# Patient Record
Sex: Female | Born: 1945 | Race: White | Hispanic: No | Marital: Married | Smoking: Never smoker
Health system: Southern US, Community
[De-identification: ages and names within clinical notes are randomized; demographics above are authoritative.]

## PROBLEM LIST (undated history)

## (undated) DIAGNOSIS — E7800 Pure hypercholesterolemia, unspecified: Secondary | ICD-10-CM

## (undated) DIAGNOSIS — I10 Essential (primary) hypertension: Secondary | ICD-10-CM

## (undated) DIAGNOSIS — K219 Gastro-esophageal reflux disease without esophagitis: Secondary | ICD-10-CM

## (undated) DIAGNOSIS — M1990 Unspecified osteoarthritis, unspecified site: Secondary | ICD-10-CM

## (undated) HISTORY — PX: ABDOMINAL HYSTERECTOMY: SHX81

## (undated) HISTORY — PX: STOMACH SURGERY: SHX791

---

## 2015-03-06 ENCOUNTER — Emergency Department (HOSPITAL_BASED_OUTPATIENT_CLINIC_OR_DEPARTMENT_OTHER)
Admission: EM | Admit: 2015-03-06 | Discharge: 2015-03-06 | Disposition: A | Discharge status: Home / Self Care | Payer: Medicare HMO | Attending: Emergency Medicine | Admitting: Emergency Medicine

## 2015-03-06 ENCOUNTER — Encounter (HOSPITAL_BASED_OUTPATIENT_CLINIC_OR_DEPARTMENT_OTHER): Payer: Self-pay | Admitting: Emergency Medicine

## 2015-03-06 DIAGNOSIS — K219 Gastro-esophageal reflux disease without esophagitis: Secondary | ICD-10-CM | POA: Insufficient documentation

## 2015-03-06 DIAGNOSIS — R51 Headache: Secondary | ICD-10-CM | POA: Insufficient documentation

## 2015-03-06 DIAGNOSIS — I10 Essential (primary) hypertension: Secondary | ICD-10-CM | POA: Diagnosis not present

## 2015-03-06 DIAGNOSIS — Z8739 Personal history of other diseases of the musculoskeletal system and connective tissue: Secondary | ICD-10-CM | POA: Diagnosis not present

## 2015-03-06 DIAGNOSIS — E7800 Pure hypercholesterolemia, unspecified: Secondary | ICD-10-CM | POA: Diagnosis not present

## 2015-03-06 DIAGNOSIS — Z79899 Other long term (current) drug therapy: Secondary | ICD-10-CM | POA: Diagnosis not present

## 2015-03-06 DIAGNOSIS — R519 Headache, unspecified: Secondary | ICD-10-CM

## 2015-03-06 HISTORY — DX: Pure hypercholesterolemia, unspecified: E78.00

## 2015-03-06 HISTORY — DX: Gastro-esophageal reflux disease without esophagitis: K21.9

## 2015-03-06 HISTORY — DX: Unspecified osteoarthritis, unspecified site: M19.90

## 2015-03-06 HISTORY — DX: Essential (primary) hypertension: I10

## 2015-03-06 LAB — CBC WITH DIFFERENTIAL/PLATELET
BASOS ABS: 0 10*3/uL (ref 0.0–0.1)
BASOS PCT: 0 %
Eosinophils Absolute: 0.1 10*3/uL (ref 0.0–0.7)
Eosinophils Relative: 1 %
HEMATOCRIT: 41.6 % (ref 36.0–46.0)
HEMOGLOBIN: 13.8 g/dL (ref 12.0–15.0)
LYMPHS PCT: 32 %
Lymphs Abs: 2.5 10*3/uL (ref 0.7–4.0)
MCH: 29.6 pg (ref 26.0–34.0)
MCHC: 33.2 g/dL (ref 30.0–36.0)
MCV: 89.3 fL (ref 78.0–100.0)
MONO ABS: 0.5 10*3/uL (ref 0.1–1.0)
Monocytes Relative: 6 %
NEUTROS ABS: 4.7 10*3/uL (ref 1.7–7.7)
NEUTROS PCT: 61 %
Platelets: 228 10*3/uL (ref 150–400)
RBC: 4.66 MIL/uL (ref 3.87–5.11)
RDW: 13.1 % (ref 11.5–15.5)
WBC: 7.7 10*3/uL (ref 4.0–10.5)

## 2015-03-06 LAB — BASIC METABOLIC PANEL
ANION GAP: 7 (ref 5–15)
BUN: 13 mg/dL (ref 6–20)
CALCIUM: 9.6 mg/dL (ref 8.9–10.3)
CO2: 29 mmol/L (ref 22–32)
Chloride: 101 mmol/L (ref 101–111)
Creatinine, Ser: 0.97 mg/dL (ref 0.44–1.00)
GFR, EST NON AFRICAN AMERICAN: 58 mL/min — AB (ref >60)
GLUCOSE: 126 mg/dL — AB (ref 65–99)
POTASSIUM: 3.8 mmol/L (ref 3.5–5.1)
Sodium: 137 mmol/L (ref 135–145)

## 2015-03-06 MED ORDER — KETOROLAC TROMETHAMINE 60 MG/2ML IM SOLN
60.0000 mg | Freq: Once | INTRAMUSCULAR | Status: AC
  Administered 2015-03-06: 60 mg via INTRAMUSCULAR
  Filled 2015-03-06: qty 2

## 2015-03-06 NOTE — ED Provider Notes (Signed)
CSN: [id number]     Arrival date & time 03/06/15  1741 History  By signing my name below, I, Jarvis Morgan, attest that this documentation has been prepared under the direction and in the presence of No att. providers found. Electronically Signed: Jarvis Morgan, ED Scribe. 03/07/2015. 12:56 PM.    Chief Complaint  Patient presents with  . Hypertension   The history is provided by the patient. No language interpreter was used.    HPI Comments: Chelsea Perez is a 69 y.o. female with a h/o HTN who presents to the Emergency Department complaining of elevated blood pressure readings since yesterday. She notes yesterday her readings yesterday had a high of 184/72 and today her highest was 176/64. She states she regularly takes losartan-hctz and amlodipine . Pt reports she took an extra dose of her amlodipine last night with no mild relief. Pt endorses associated HA. Describes it as sinus pressure, and reports it is still present and dull now despite BP being normal.  Reports seeing ENT physician before for sinus irritation and has had CT scans done of her sinuses. Read side effects of losartan and notes that sinus inflmamation is one of these.  She denies any alleviating/aggravating factors. She called her PCP today who sent her to the ER for evaluation. She denies any recent illness. Pt denies any congestion, cough, sore throat, chest pain, SOB, vision changes, slurred speech, change in speech, difficulty walking, weakness to one side of body, numbness, dizziness, or other associated symptoms.   Past Medical History  Diagnosis Date  . Arthritis   . Hypertension   . GERD (gastroesophageal reflux disease)   . High cholesterol    Past Surgical History  Procedure Laterality Date  . Stomach surgery      For reflux  . Abdominal hysterectomy     No family history on file. Social History  Substance Use Topics  . Smoking status: Never Smoker   . Smokeless tobacco: None  . Alcohol Use: No    OB History    No data available     Review of Systems  Constitutional: Negative for fever.  HENT: Positive for sinus pressure. Negative for congestion and sore throat.   Eyes: Negative for visual disturbance.  Respiratory: Negative for cough and shortness of breath.   Cardiovascular: Negative for chest pain.  Gastrointestinal: Negative for nausea, vomiting, abdominal pain and diarrhea.  Genitourinary: Negative for difficulty urinating.  Musculoskeletal: Negative for back pain, gait problem and neck pain.  Skin: Negative for rash.  Neurological: Positive for headaches. Negative for dizziness, syncope, speech difficulty, weakness, light-headedness and numbness.      Allergies  Review of patient's allergies indicates no known allergies.  Home Medications   Prior to Admission medications   Medication Sig Start Date End Date Taking? Authorizing Provider  amLODipine (NORVASC) 5 MG tablet Take 5 mg by mouth daily.   Yes Historical Provider, MD  losartan-hydrochlorothiazide (HYZAAR) 100-25 MG tablet Take 1 tablet by mouth daily.   Yes Historical Provider, MD  omeprazole (PRILOSEC) 40 MG capsule Take 40 mg by mouth daily.   Yes Historical Provider, MD  pravastatin (PRAVACHOL) 10 MG tablet Take 10 mg by mouth daily.   Yes Historical Provider, MD   Triage Vitals: BP 125/73 mmHg  Pulse 71  Temp(Src) 97.8 F (36.6 C) (Oral)  Resp 18  Ht  (1.626 m)  Wt 152 lb (68.947 kg)  BMI 26.08 kg/m2  SpO2 98%  Physical Exam  Constitutional: She is  oriented to person, place, and time. She appears well-developed and well-nourished. No distress.  HENT:  Head: Normocephalic and atraumatic.  Eyes: Conjunctivae and EOM are normal. Pupils are equal, round, and reactive to light.  Neck: Normal range of motion. Neck supple. No tracheal deviation present.  Cardiovascular: Normal rate, regular rhythm, normal heart sounds and intact distal pulses.  Exam reveals no gallop and no friction rub.   No  murmur heard. Pulmonary/Chest: Effort normal and breath sounds normal. No respiratory distress. She has no wheezes. She has no rales.  Abdominal: Soft. She exhibits no distension. There is no tenderness. There is no guarding.  Musculoskeletal: Normal range of motion. She exhibits no edema or tenderness.  Neurological: She is alert and oriented to person, place, and time. She has normal strength. No cranial nerve deficit or sensory deficit. Coordination and gait normal. GCS eye subscore is 4. GCS verbal subscore is 5. GCS motor subscore is 6.  Skin: Skin is warm and dry. No rash noted. She is not diaphoretic. No erythema.  Psychiatric: She has a normal mood and affect. Her behavior is normal.  Nursing note and vitals reviewed.   ED Course  Procedures (including critical care time)  DIAGNOSTIC STUDIES: Oxygen Saturation is 98% on RA, normal by my interpretation.    COORDINATION OF CARE: 8:20 PM- Will order BMP and CBC w/diff. Pt advised of plan for treatment and pt agrees.    Labs Review Labs Reviewed  BASIC METABOLIC PANEL - Abnormal; Notable for the following:    Glucose, Bld 126 (*)    GFR calc non Af Amer 58 (*)    All other components within normal limits  CBC WITH DIFFERENTIAL/PLATELET    Imaging Review No results found. I have personally reviewed and evaluated these lab results as part of my medical decision-making.   EKG Interpretation None      MDM   Final diagnoses:  Essential hypertension  Sinus headache   69yo female with history of hypertension, sinus pain for which she has seen ENT, hyperlipidemia, presents with concern for elevated BP and headache. Pt instructed to come to ED by PCP.  When blood pressure was checked today, it was found to be 170s systolic and patient presented to the ED. On arrival to the ED, BP was 140s systolic and trended down to 110.  Reports HA that began slowly, located around the sinuses, and consistent with prior irritation she has  had evaluation for with ENT.  No nasal discharge/fevers and doubt bacterial sinusitis. Patient without neurologic symptoms, no chest pain, no shortness of breath and have low suspicion for hypertensive emergencies including low suspicion for [facility]AH, hypertensive encephalopathy, stroke, MI, aortic dissection, pulmonary edema.  BMP was checked showing normal renal function. Given dose of toradol for sinus pain. Discussed importance of close primary care follow up and reasons to return to the ED in detail. Patient discharged in stable condition with understanding of reasons to return.     I personally performed the services described in this documentation, which was scribed in my presence. The recorded information has been reviewed and is accurate.     Alvira MondayErin Shianne Zeiser, MD 03/07/15 1259

## 2015-03-06 NOTE — ED Notes (Signed)
Elevated bp since yesterday.  Sent to Ed by her pmd office for eval.

## 2016-05-02 ENCOUNTER — Encounter (INDEPENDENT_AMBULATORY_CARE_PROVIDER_SITE_OTHER): Payer: Medicare HMO | Admitting: Ophthalmology

## 2016-05-02 DIAGNOSIS — I10 Essential (primary) hypertension: Secondary | ICD-10-CM

## 2016-05-02 DIAGNOSIS — H3562 Retinal hemorrhage, left eye: Secondary | ICD-10-CM

## 2016-05-02 DIAGNOSIS — H43813 Vitreous degeneration, bilateral: Secondary | ICD-10-CM

## 2016-05-02 DIAGNOSIS — H35033 Hypertensive retinopathy, bilateral: Secondary | ICD-10-CM

## 2016-06-13 ENCOUNTER — Encounter (INDEPENDENT_AMBULATORY_CARE_PROVIDER_SITE_OTHER): Payer: Medicare HMO | Admitting: Ophthalmology

## 2016-06-13 DIAGNOSIS — H43813 Vitreous degeneration, bilateral: Secondary | ICD-10-CM

## 2016-06-13 DIAGNOSIS — H35033 Hypertensive retinopathy, bilateral: Secondary | ICD-10-CM | POA: Diagnosis not present

## 2016-06-13 DIAGNOSIS — H3562 Retinal hemorrhage, left eye: Secondary | ICD-10-CM | POA: Diagnosis not present

## 2018-10-04 ENCOUNTER — Other Ambulatory Visit: Payer: Self-pay

## 2018-10-04 ENCOUNTER — Emergency Department (HOSPITAL_BASED_OUTPATIENT_CLINIC_OR_DEPARTMENT_OTHER): Payer: Medicare HMO

## 2018-10-04 ENCOUNTER — Encounter (HOSPITAL_BASED_OUTPATIENT_CLINIC_OR_DEPARTMENT_OTHER): Payer: Self-pay | Admitting: Emergency Medicine

## 2018-10-04 ENCOUNTER — Emergency Department (HOSPITAL_BASED_OUTPATIENT_CLINIC_OR_DEPARTMENT_OTHER)
Admission: EM | Admit: 2018-10-04 | Discharge: 2018-10-04 | Disposition: A | Discharge status: Home / Self Care | Payer: Medicare HMO | Attending: Emergency Medicine | Admitting: Emergency Medicine

## 2018-10-04 DIAGNOSIS — R079 Chest pain, unspecified: Secondary | ICD-10-CM | POA: Diagnosis not present

## 2018-10-04 DIAGNOSIS — Y999 Unspecified external cause status: Secondary | ICD-10-CM | POA: Insufficient documentation

## 2018-10-04 DIAGNOSIS — Z20828 Contact with and (suspected) exposure to other viral communicable diseases: Secondary | ICD-10-CM | POA: Insufficient documentation

## 2018-10-04 DIAGNOSIS — Y9389 Activity, other specified: Secondary | ICD-10-CM | POA: Diagnosis not present

## 2018-10-04 DIAGNOSIS — I10 Essential (primary) hypertension: Secondary | ICD-10-CM | POA: Insufficient documentation

## 2018-10-04 DIAGNOSIS — S06339A Contusion and laceration of cerebrum, unspecified, with loss of consciousness of unspecified duration, initial encounter: Secondary | ICD-10-CM | POA: Insufficient documentation

## 2018-10-04 DIAGNOSIS — W01198A Fall on same level from slipping, tripping and stumbling with subsequent striking against other object, initial encounter: Secondary | ICD-10-CM | POA: Insufficient documentation

## 2018-10-04 DIAGNOSIS — Y92000 Kitchen of unspecified non-institutional (private) residence as  the place of occurrence of the external cause: Secondary | ICD-10-CM | POA: Diagnosis not present

## 2018-10-04 DIAGNOSIS — R55 Syncope and collapse: Secondary | ICD-10-CM | POA: Insufficient documentation

## 2018-10-04 LAB — URINALYSIS, ROUTINE W REFLEX MICROSCOPIC
Bilirubin Urine: NEGATIVE
Glucose, UA: NEGATIVE mg/dL
Ketones, ur: NEGATIVE mg/dL
Leukocytes,Ua: NEGATIVE
Nitrite: NEGATIVE
Protein, ur: NEGATIVE mg/dL
Specific Gravity, Urine: 1.02 (ref 1.005–1.030)
pH: 6 (ref 5.0–8.0)

## 2018-10-04 LAB — BASIC METABOLIC PANEL
Anion gap: 10 (ref 5–15)
BUN: 16 mg/dL (ref 8–23)
CO2: 25 mmol/L (ref 22–32)
Calcium: 9.5 mg/dL (ref 8.9–10.3)
Chloride: 100 mmol/L (ref 98–111)
Creatinine, Ser: 1.09 mg/dL — ABNORMAL HIGH (ref 0.44–1.00)
GFR calc Af Amer: 58 mL/min — ABNORMAL LOW (ref >60)
GFR calc non Af Amer: 50 mL/min — ABNORMAL LOW (ref >60)
Glucose, Bld: 106 mg/dL — ABNORMAL HIGH (ref 70–99)
Potassium: 3.4 mmol/L — ABNORMAL LOW (ref 3.5–5.1)
Sodium: 135 mmol/L (ref 135–145)

## 2018-10-04 LAB — CBC
HCT: 44.9 % (ref 36.0–46.0)
Hemoglobin: 14.5 g/dL (ref 12.0–15.0)
MCH: 28.9 pg (ref 26.0–34.0)
MCHC: 32.3 g/dL (ref 30.0–36.0)
MCV: 89.6 fL (ref 80.0–100.0)
Platelets: 263 10*3/uL (ref 150–400)
RBC: 5.01 MIL/uL (ref 3.87–5.11)
RDW: 13.1 % (ref 11.5–15.5)
WBC: 10.7 10*3/uL — ABNORMAL HIGH (ref 4.0–10.5)
nRBC: 0 % (ref 0.0–0.2)

## 2018-10-04 LAB — URINALYSIS, MICROSCOPIC (REFLEX)

## 2018-10-04 LAB — TROPONIN I (HIGH SENSITIVITY): Troponin I (High Sensitivity): 3 ng/L (ref <18)

## 2018-10-04 LAB — SARS CORONAVIRUS 2 AG (30 MIN TAT): SARS Coronavirus 2 Ag: NEGATIVE

## 2018-10-04 NOTE — Discharge Instructions (Addendum)
You were evaluated in the Emergency Department and after careful evaluation, we did not find any emergent condition requiring admission or further testing in the hospital.  Your fainting spell seems to have been caused by orthostatic hypotension.  Your fall did cause a concussion with a small amount of bleeding near the bruised portion of the brain.  Luckily your exam and testing today is otherwise reassuring and you can safely be discharged home.  We recommend mental and physical rest for the next 2 to 3 days and close follow-up with your primary care doctor.  Please return to the Emergency Department if you experience any worsening of your condition.  We encourage you to follow up with a primary care provider.  Thank you for allowing [place] to be a part of your care.

## 2018-10-04 NOTE — ED Provider Notes (Signed)
[facility] Emergency Department Provider Note MRN:  [id number]  Arrival date & time: 10/04/18     Chief Complaint   Loss of Consciousness   History of Present Illness   Chelsea Perez is a 73 y.o. year-old female with a history of hypertension, GERD presenting to the ED with chief complaint of syncope.  Patient explains that she got out of bed yesterday morning, walked into the kitchen, took 1 of her morning medications, felt like the pill lodged in her chest causing her moderate severity chest pain with nausea.  Then she does not remember what occurred.  She is told by her husband that she passed out and fell to the ground.  Unknown if she hit her head, felt nauseated with 2 episodes of vomiting after return of consciousness.  Has been feeling continued frontal headache, nausea since the fall, denies any further chest pain, no shortness of breath, no fever, no cough, no abdominal pain.  No dysuria.  Review of Systems  A complete 10 system review of systems was obtained and all systems are negative except as noted in the HPI and PMH.   Patient's Health History    Past Medical History:  Diagnosis Date   Arthritis    GERD (gastroesophageal reflux disease)    High cholesterol    Hypertension     Past Surgical History:  Procedure Laterality Date   ABDOMINAL HYSTERECTOMY     STOMACH SURGERY     For reflux    No family history on file.  Social History   Socioeconomic History   Marital status: Married    Spouse name: Not on file   Number of children: Not on file   Years of education: Not on file   Highest education level: Not on file  Occupational History   Not on file  Social Needs   Financial resource strain: Not on file   Food insecurity    Worry: Not on file    Inability: Not on file   Transportation needs    Medical: Not on file    Non-medical: Not on file  Tobacco Use   Smoking status: Never Smoker   Smokeless tobacco:  Never Used  Substance and Sexual Activity   Alcohol use: No   Drug use: No   Sexual activity: Yes    Birth control/protection: Surgical  Lifestyle   Physical activity    Days per week: Not on file    Minutes per session: Not on file   Stress: Not on file  Relationships   Social connections    Talks on phone: Not on file    Gets together: Not on file    Attends religious service: Not on file    Active member of club or organization: Not on file    Attends meetings of clubs or organizations: Not on file    Relationship status: Not on file   Intimate partner violence    Fear of current or ex partner: Not on file    Emotionally abused: Not on file    Physically abused: Not on file    Forced sexual activity: Not on file  Other Topics Concern   Not on file  Social History Narrative   Not on file     Physical Exam  Vital Signs and Nursing Notes reviewed Vitals:   10/04/18 1230 10/04/18 1300  BP: (!) 135/58 126/84  Pulse: 68 75  Resp: 14 (!) 21  Temp:    SpO2: 100%  99%    CONSTITUTIONAL: Well-appearing, NAD NEURO:  Alert and oriented x 3, normal and symmetric strength and sensation, normal coordination, normal speech, normal gait EYES:  eyes equal and reactive, normal extraocular movements, no nystagmus ENT/NECK:  no LAD, no JVD CARDIO: Regular rate, well-perfused, normal S1 and S2 PULM:  CTAB no wheezing or rhonchi GI/GU:  normal bowel sounds, non-distended, non-tender MSK/SPINE:  No gross deformities, no edema SKIN:  no rash, atraumatic PSYCH:  Appropriate speech and behavior  Diagnostic and Interventional Summary    EKG Interpretation  Date/Time:  Monday October 04 2018 10:58:50 EDT Ventricular Rate:  74 PR Interval:    QRS Duration: 70 QT Interval:  393 QTC Calculation: 436 R Axis:   54 Text Interpretation:  Sinus rhythm Atrial premature complex Borderline repolarization abnormality Confirmed by Kennis CarinaBero, Jaquil Todt [phone]) on 10/04/2018 11:09:58 AM Also  confirmed by Kennis CarinaBero, Gay Rape [phone]), editor Barbette Hairassel, Kerry [phone])  on 10/04/2018 11:15:55 AM      Labs Reviewed  BASIC METABOLIC PANEL - Abnormal; Notable for the following components:      Result Value   Potassium 3.4 (*)    Glucose, Bld 106 (*)    Creatinine, Ser 1.09 (*)    GFR calc non Af Amer 50 (*)    GFR calc Af Amer 58 (*)    All other components within normal limits  CBC - Abnormal; Notable for the following components:   WBC 10.7 (*)    All other components within normal limits  URINALYSIS, ROUTINE W REFLEX MICROSCOPIC - Abnormal; Notable for the following components:   APPearance CLOUDY (*)    Hgb urine dipstick TRACE (*)    All other components within normal limits  URINALYSIS, MICROSCOPIC (REFLEX) - Abnormal; Notable for the following components:   Bacteria, UA FEW (*)    All other components within normal limits  SARS CORONAVIRUS 2 (HOSP ORDER, PERFORMED IN [facility] VIA ABBOTT ID)  TROPONIN I (HIGH SENSITIVITY)  TROPONIN I (HIGH SENSITIVITY)    DG Chest 2 View  Final Result    CT HEAD WO CONTRAST  Final Result      Medications - No data to display   Procedures Critical Care Critical Care Documentation Critical care time provided by me (excluding procedures): 32 minutes  Condition necessitating critical care: Hemorrhagic contusions of the brain  Components of critical care management: reviewing of prior records, laboratory and imaging interpretation, frequent re-examination and reassessment of vital signs, discussion with consulting services.    ED Course and Medical Decision Making  I have reviewed the triage vital signs and the nursing notes.  Pertinent labs & imaging results that were available during my care of the patient were reviewed by me and considered in my medical decision making (see below for details).  Favoring orthostatic hypotension causing syncopal event in this 73 year old female.  Question of head trauma, continued headache,  also considering traumatic intracranial bleeding.  Patient is without neurological deficits, no nystagmus, nothing to suggest posterior circulation stroke.  Chest pain seems to be more related to GERD, however also considering ACS, troponin is pending.  EKG is without ischemic features.  Called by radiology, patient is found to have hemorrhagic cerebral contusions without mass-effect, without midline shift.  This finding was discussed with neurosurgery on-call, who is reassured by the imaging and had no further recommendations, no need to admit or reimage based on this finding.  Work-up is otherwise unremarkable, troponin is less than 4 and patient's heart score is 3,  patient's pain was atypical and more consistent with prior episodes of GERD that she has had in the past, per our high-sensitivity troponin algorithm she is a candidate for early discharge with a single troponin.  She has normal vital signs, no history of structural heart disease, does not need admission for syncope.  Favoring orthostatic hypotension, advised to take her time changing positions and follow-up with PCP.  After the discussed management above, the patient was determined to be safe for discharge.  The patient was in agreement with this plan and all questions regarding their care were answered.  ED return precautions were discussed and the patient will return to the ED with any significant worsening of condition.  Barth Kirks. Sedonia Small, [place] [email or web address]  Final Clinical Impressions(s) / ED Diagnoses     ICD-10-CM   1. Syncope and collapse  R55   2. Chest pain  R07.9 DG Chest 2 View    DG Chest 2 View    CANCELED: DG Chest Port 1 View    CANCELED: DG Chest Port 1 View  3. Contusion of cerebrum with loss of consciousness, unspecified laterality, initial encounter [facility])  [id number]     ED Discharge Orders    None         Maudie Flakes, MD 10/04/18 1316

## 2018-10-04 NOTE — ED Triage Notes (Signed)
Pt states she was taking some medication yesterday and the next thing she knew her husband was standing over her and she was lying on the floor. C/o ongoing dizziness, nausea and headache.

## 2018-10-04 NOTE — ED Notes (Signed)
Patient transported to CT 

## 2020-08-05 IMAGING — CT CT HEAD WITHOUT CONTRAST
3 series · 14 of 46 positions shown, 16 images · non-contrast
Comparison: October 30, 2016

CLINICAL DATA: Dizziness and headache after fall

EXAM:
CT HEAD WITHOUT CONTRAST
TECHNIQUE: Contiguous axial images were obtained from the base of the skull
through the vertex without intravenous contrast.

[Series 2: head wo · axial · 0.42mm/px · z∈[-129,-9]mm · 8 of 29 slices shown, 10 images]
[im 3/29  brain]
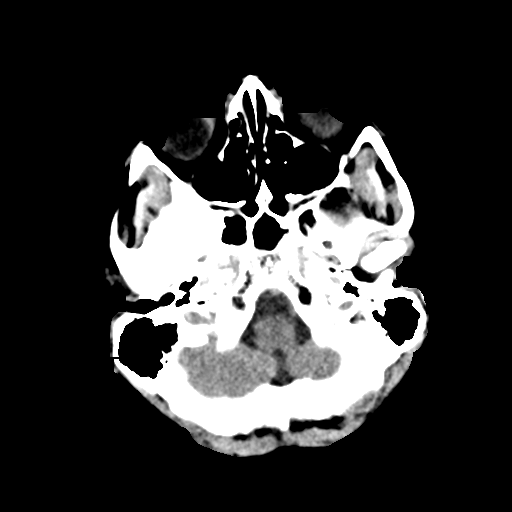
[im 3/29  bone]
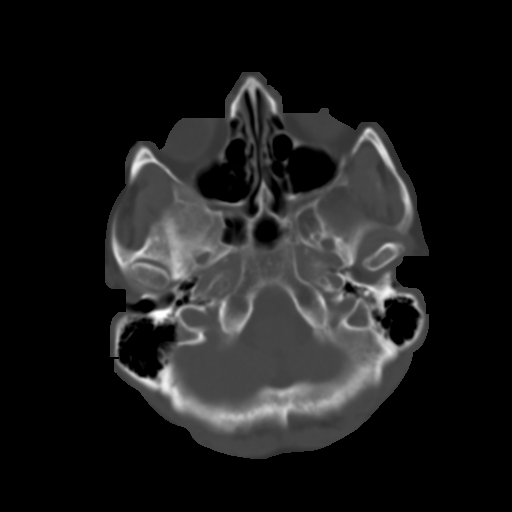
[im 7/29  brain]
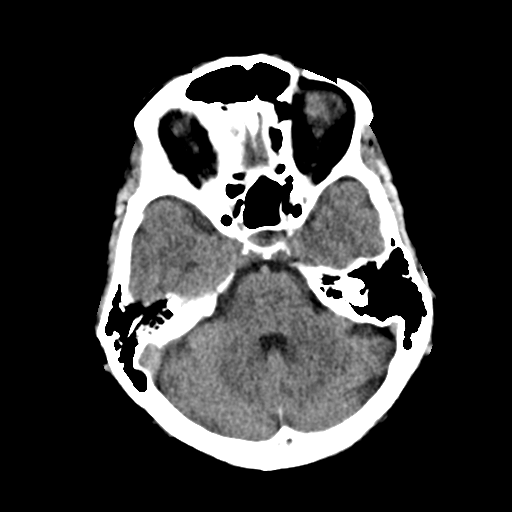
[im 10/29  brain]
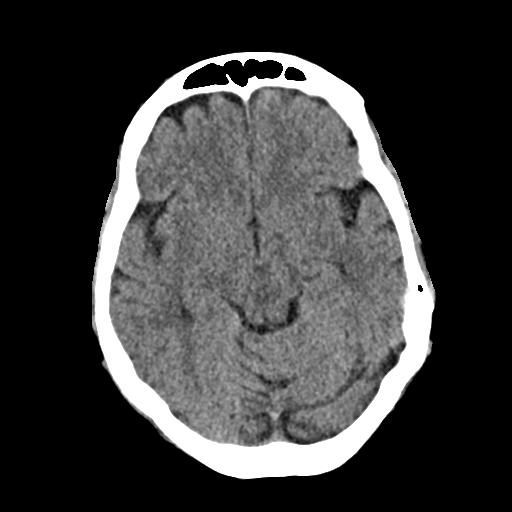
[im 13/29  brain]
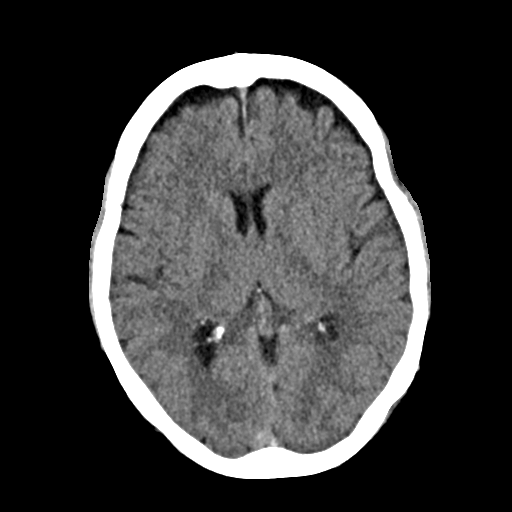
[im 17/29  brain]
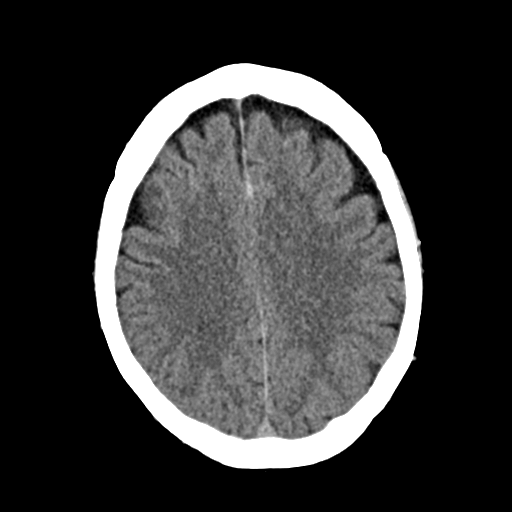
[im 17/29  bone]
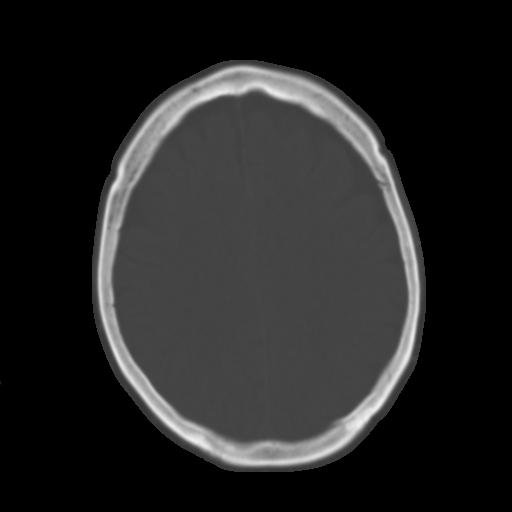
[im 20/29  brain]
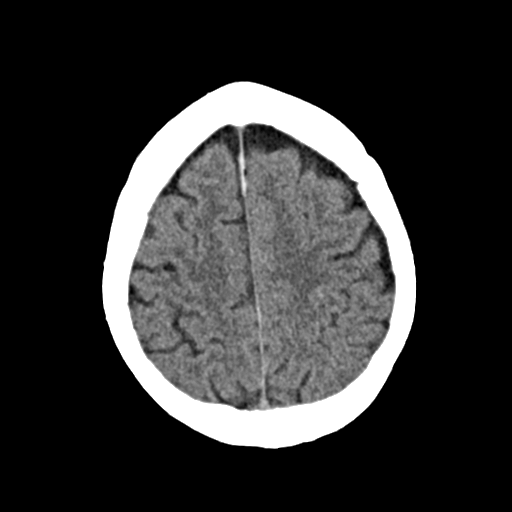
[im 23/29  brain]
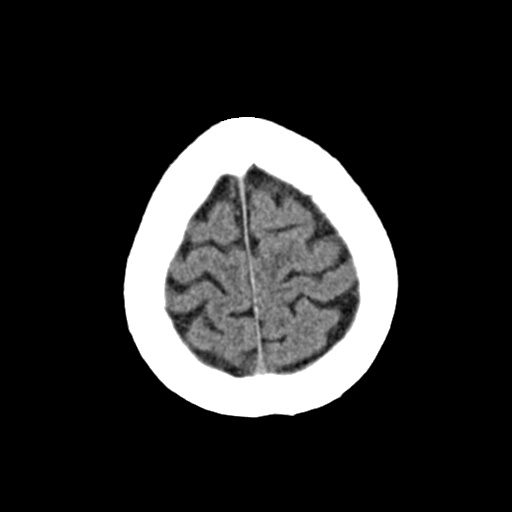
[im 27/29  brain]
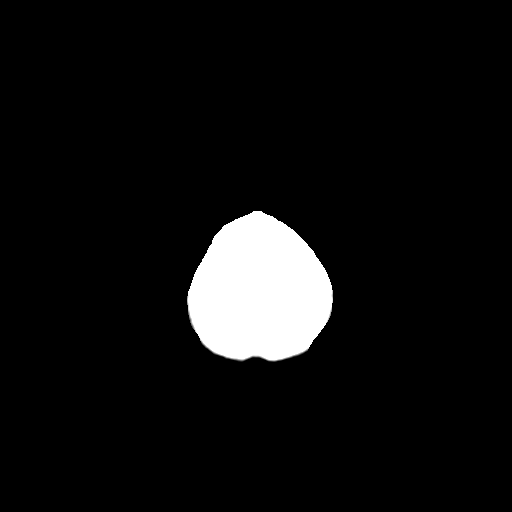

[Series 4: coronal soft · coronal · 0.28mm/px · 3 of 61 slices shown]
[im 21/61  brain]
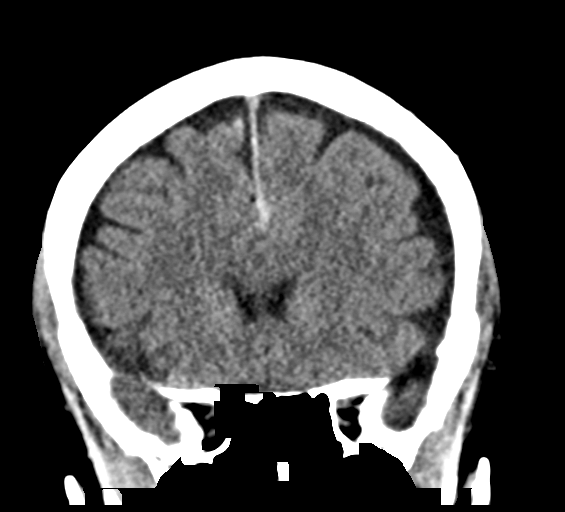
[im 27/61  brain]
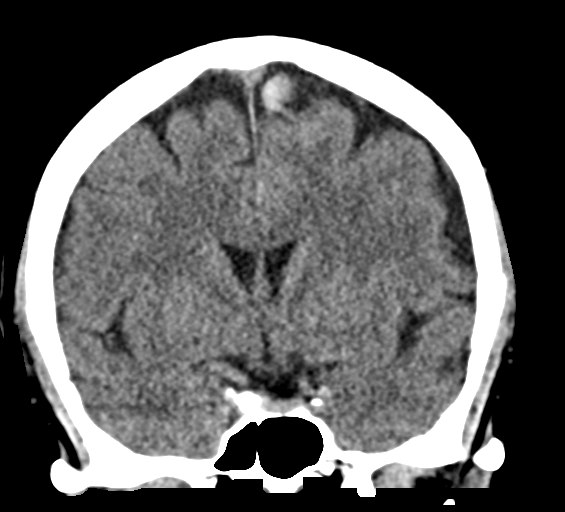
[im 34/61  brain]
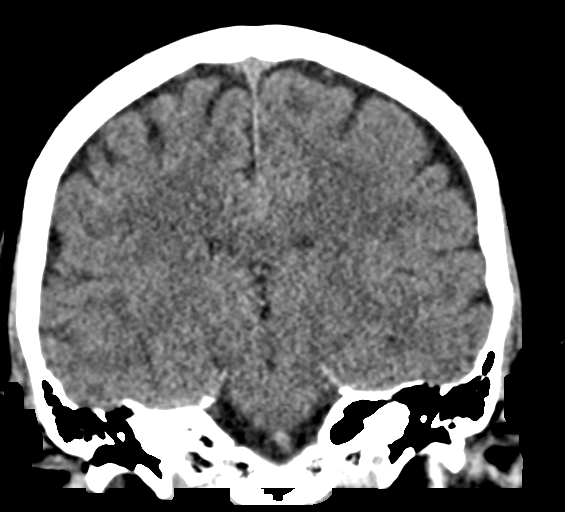

[Series 5: sag soft · sagittal · 0.27mm/px · 3 of 54 slices shown]
[im 18/54  brain]
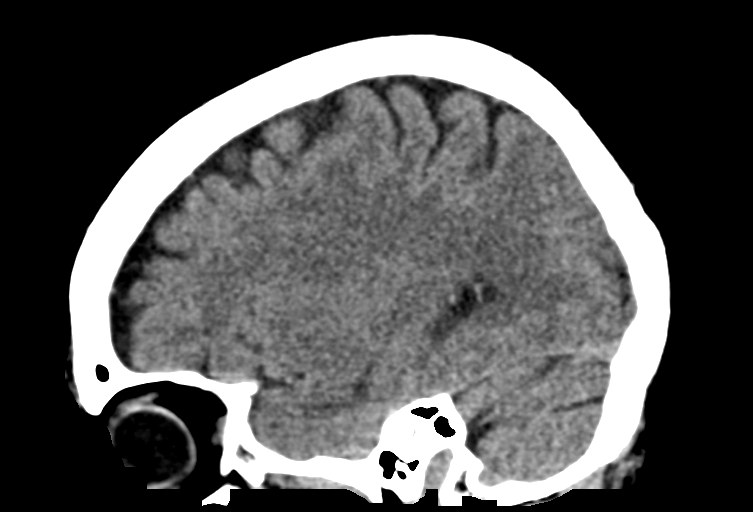
[im 27/54  brain]
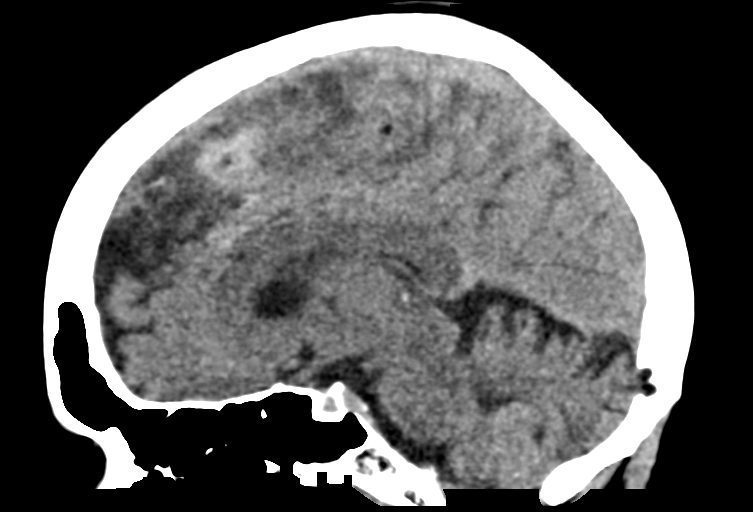
[im 36/54  brain]
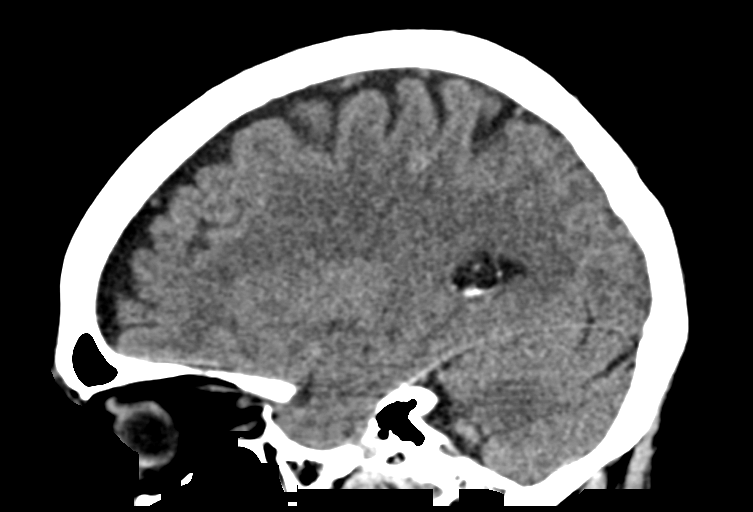

[14 of 46 positions shown; findings below may reference images not displayed]

FINDINGS: Brain: Ventricles are normal in size and configuration. There is
slight frontal atrophy bilaterally. There is a focal area of
hemorrhage in the anterior, medial right frontal lobe measuring 8 x
6 mm. There is a second focus of hemorrhage in the anterior superior
left frontal lobe measures 1.0 x 0.8 cm. There is a cyst suspected
small amount of hemorrhage in the anterior falx, likely representing
a small falcine subdural hematoma. No mass effect is seen in this
area. No other foci of hemorrhage are apparent. There is no midline
shift. No acute infarct evident. No well-defined mass.

Vascular: No hyperdense vessel. There is calcification in the right
carotid siphon.

Skull: The bony calvarium appears intact.

Sinuses/Orbits: Visualized paranasal sinuses are clear. Orbits
appear symmetric bilaterally.

Other: Mastoid air cells are clear.
IMPRESSION: Apparent hemorrhagic contusion in each anterior frontal lobe. No
appreciable mass effect or edema in these areas. Suspect small
anterior falcine subdural hematoma without appreciable mass effect.

No midline shift or mass evident.  No acute infarct.

Mild arterial vascular calcification noted.

Critical Value/emergent results were called by telephone at the time
of interpretation on 10/04/2018 at [DATE] to Dr. GALANIS OIK , who
verbally acknowledged these results.
